# Patient Record
Sex: Female | Born: 1995 | Race: White | Hispanic: No | Marital: Single
Health system: Southern US, Community
[De-identification: ages and names within clinical notes are randomized; demographics above are authoritative.]

---
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
## 2019-06-17 ENCOUNTER — Telehealth: Payer: Self-pay | Admitting: Physician Assistant

## 2019-06-17 ENCOUNTER — Encounter: Payer: Self-pay | Admitting: Physician Assistant

## 2019-06-17 ENCOUNTER — Telehealth: Payer: Self-pay | Admitting: *Deleted

## 2019-06-17 ENCOUNTER — Encounter (INDEPENDENT_AMBULATORY_CARE_PROVIDER_SITE_OTHER): Payer: Self-pay

## 2019-06-17 DIAGNOSIS — Z20822 Contact with and (suspected) exposure to covid-19: Secondary | ICD-10-CM

## 2019-06-17 DIAGNOSIS — Z20828 Contact with and (suspected) exposure to other viral communicable diseases: Secondary | ICD-10-CM

## 2019-06-17 NOTE — Telephone Encounter (Signed)
Pt scheduled for covid testing at [facility] site tomorrow 06/18/2019.  States direct exposure from boyfriend who tested positive.  States did online screening ,Chat Bot, meets criteria.  Testing process reviewed, wear mask, stay in car; pt verbalizes understanding.   Pts CB# [phone]

## 2019-06-17 NOTE — Progress Notes (Signed)
E-Visit for [facility] Virus Screening   Call your health care provider or local health department to request and arrange formal testing. Many health care providers can now test patients at their office but not all are.  Please quarantine yourself while awaiting your test results.  [place] [phone], [place], [place] [phone] or visit [email or web address]  and You have been enrolled in [place] for COVID-19.  Daily you will receive a questionnaire within the [facility] website. Our COVID-19 response team will be monitoring your responses daily.    COVID-19 is a respiratory illness with symptoms that are similar to the flu. Symptoms are typically mild to moderate, but there have been cases of severe illness and death due to the virus. The following symptoms may appear 2-14 days after exposure: . Fever . Cough . Shortness of breath or difficulty breathing . Chills . Repeated shaking with chills . Muscle pain . Headache . Sore throat . New loss of taste or smell . Fatigue . Congestion or runny nose . Nausea or vomiting . Diarrhea  It is vitally important that if you feel that you have an infection such as this virus or any other virus that you stay home and away from places where you may spread it to others.  You should self-quarantine for 14 days if you have symptoms that could potentially be coronavirus or have been in close contact a with a person diagnosed with COVID-19 within the last 2 weeks. You should avoid contact with people age 59 and older.   You should wear a mask or cloth face covering over your nose and mouth if you must be around other people or animals, including pets (even at home). Try to stay at least 6 feet away from other people. This will protect the people around you.    You may also take acetaminophen  (Tylenol) as needed for fever.   Reduce your risk of any infection by using the same precautions used for avoiding the common cold or flu:  [place] Kitchen Wash your hands often with soap and warm water for at least 20 seconds.  If soap and water are not readily available, use an alcohol-based hand sanitizer with at least 60% alcohol.  . If coughing or sneezing, cover your mouth and nose by coughing or sneezing into the elbow areas of your shirt or coat, into a tissue or into your sleeve (not your hands). . Avoid shaking hands with others and consider head nods or verbal greetings only. . Avoid touching your eyes, nose, or mouth with unwashed hands.  . Avoid close contact with people who are sick. . Avoid places or events with large numbers of people in one location, like concerts or sporting events. . Carefully consider travel plans you have or are making. . If you are planning any travel outside or inside the [place], visit the CDC's Travelers' Health webpage for the latest health notices. . If you have some symptoms but not all symptoms, continue to monitor at home and seek medical attention if your symptoms worsen. . If you are having a medical emergency, call 911.  HOME CARE . Only take medications as instructed by your medical team. . Drink plenty of fluids and get plenty of rest. . A steam or ultrasonic humidifier can help if you have congestion.   GET HELP RIGHT AWAY IF YOU HAVE EMERGENCY WARNING SIGNS** FOR COVID-19. If you or someone is showing any of these signs  seek emergency medical care immediately. Call 911 or proceed to your closest emergency facility if: . You develop worsening high fever. . Trouble breathing . Bluish lips or face . Persistent pain or pressure in the chest . New confusion . Inability to wake or stay awake . You cough up blood. . Your symptoms become more severe  **This list is not all possible symptoms. Contact your medical provider for any symptoms that are sever or  concerning to you.   MAKE SURE YOU   Understand these instructions.  Will watch your condition.  Will get help right away if you are not doing well or get worse.  Your e-visit answers were reviewed by a board certified advanced clinical practitioner to complete your personal care plan.  Depending on the condition, your plan could have included both over the counter or prescription medications.  If there is a problem please reply once you have received a response from your provider.  Your safety is important to us.  If you have drug allergies check your prescription carefully.    You can use MyChart to ask questions about today's visit, request a non-urgent call back, or ask for a work or school excuse for 24 hours related to this e-Visit. If it has been greater than 24 hours you will need to follow up with your provider, or enter a new e-Visit to address those concerns. You will get an e-mail in the next two days asking about your experience.  I hope that your e-visit has been valuable and will speed your recovery. Thank you for using e-visits.   I spent 5-10 minutes on review and completion of this note- Illa LevelSahar Nathifa Ritthaler [facility]

## 2019-06-18 ENCOUNTER — Encounter (INDEPENDENT_AMBULATORY_CARE_PROVIDER_SITE_OTHER): Payer: Self-pay

## 2019-06-18 ENCOUNTER — Other Ambulatory Visit: Payer: Self-pay

## 2019-06-18 DIAGNOSIS — Z20822 Contact with and (suspected) exposure to covid-19: Secondary | ICD-10-CM

## 2019-06-19 ENCOUNTER — Encounter (INDEPENDENT_AMBULATORY_CARE_PROVIDER_SITE_OTHER): Payer: Self-pay

## 2019-06-20 ENCOUNTER — Encounter (INDEPENDENT_AMBULATORY_CARE_PROVIDER_SITE_OTHER): Payer: Self-pay

## 2019-06-21 ENCOUNTER — Encounter (INDEPENDENT_AMBULATORY_CARE_PROVIDER_SITE_OTHER): Payer: Self-pay

## 2019-06-22 ENCOUNTER — Encounter (INDEPENDENT_AMBULATORY_CARE_PROVIDER_SITE_OTHER): Payer: Self-pay

## 2019-06-22 LAB — NOVEL CORONAVIRUS, NAA: SARS-CoV-2, NAA: NOT DETECTED
